# Patient Record
Sex: Male | Born: 1990 | Race: Black or African American | Hispanic: No | Marital: Single | Smoking: Current every day smoker
Health system: Southern US, Community
[De-identification: ages and names within clinical notes are randomized; demographics above are authoritative.]

## PROBLEM LIST (undated history)

## (undated) HISTORY — PX: KNEE SURGERY: SHX244

## (undated) HISTORY — PX: HIP SURGERY: SHX245

---

## 2018-07-06 ENCOUNTER — Encounter: Payer: Self-pay | Admitting: Emergency Medicine

## 2018-07-06 ENCOUNTER — Other Ambulatory Visit: Payer: Self-pay

## 2018-07-06 ENCOUNTER — Emergency Department
Admission: EM | Admit: 2018-07-06 | Discharge: 2018-07-06 | Disposition: A | Discharge status: Home / Self Care | Payer: No Typology Code available for payment source | Attending: Student in an Organized Health Care Education/Training Program | Admitting: Student in an Organized Health Care Education/Training Program

## 2018-07-06 DIAGNOSIS — M549 Dorsalgia, unspecified: Secondary | ICD-10-CM | POA: Insufficient documentation

## 2018-07-06 MED ORDER — OXYCODONE-ACETAMINOPHEN 7.5-325 MG PO TABS: 1.0000 | ORAL_TABLET | Freq: Four times a day (QID) | ORAL | 0 refills | Status: AC | PRN

## 2018-07-06 MED ORDER — CYCLOBENZAPRINE HCL 10 MG PO TABS
10.0000 mg | ORAL_TABLET | Freq: Once | ORAL | Status: AC
  Administered 2018-07-06: 10 mg via ORAL
  Filled 2018-07-06: qty 1

## 2018-07-06 MED ORDER — IBUPROFEN 600 MG PO TABS: 600.0000 mg | ORAL_TABLET | Freq: Three times a day (TID) | ORAL | 0 refills | Status: DC | PRN

## 2018-07-06 MED ORDER — OXYCODONE-ACETAMINOPHEN 5-325 MG PO TABS
1.0000 | ORAL_TABLET | Freq: Once | ORAL | Status: AC
  Administered 2018-07-06: 1 via ORAL
  Filled 2018-07-06: qty 1

## 2018-07-06 MED ORDER — IBUPROFEN 600 MG PO TABS
600.0000 mg | ORAL_TABLET | Freq: Once | ORAL | Status: AC
  Administered 2018-07-06: 600 mg via ORAL
  Filled 2018-07-06: qty 1

## 2018-07-06 MED ORDER — CYCLOBENZAPRINE HCL 10 MG PO TABS: 10.0000 mg | ORAL_TABLET | Freq: Three times a day (TID) | ORAL | 0 refills | Status: DC | PRN
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
## 2018-07-06 NOTE — Discharge Instructions (Addendum)
Take medication as directed.

## 2019-02-16 ENCOUNTER — Emergency Department
Admission: EM | Admit: 2019-02-16 | Discharge: 2019-02-16 | Disposition: A | Discharge status: Home / Self Care | Payer: Self-pay | Attending: Emergency Medicine | Admitting: Emergency Medicine

## 2019-02-16 ENCOUNTER — Other Ambulatory Visit: Payer: Self-pay

## 2019-02-16 ENCOUNTER — Emergency Department: Payer: Self-pay

## 2019-02-16 DIAGNOSIS — M79604 Pain in right leg: Secondary | ICD-10-CM | POA: Insufficient documentation

## 2019-02-16 DIAGNOSIS — F17200 Nicotine dependence, unspecified, uncomplicated: Secondary | ICD-10-CM | POA: Insufficient documentation

## 2019-02-16 DIAGNOSIS — M1711 Unilateral primary osteoarthritis, right knee: Secondary | ICD-10-CM | POA: Insufficient documentation

## 2019-02-16 MED ORDER — IBUPROFEN 600 MG PO TABS: 600.0000 mg | ORAL_TABLET | Freq: Four times a day (QID) | ORAL | 0 refills | Status: DC | PRN

## 2019-02-16 MED ORDER — MELOXICAM 7.5 MG PO TABS
15.0000 mg | ORAL_TABLET | Freq: Every day | ORAL | Status: DC
  Administered 2019-02-16: 15 mg via ORAL
  Filled 2019-02-16: qty 2

## 2019-02-16 NOTE — ED Provider Notes (Signed)
[facility] Emergency Department Provider Note  ____________________________________________  Time seen: Approximately 5:55 PM  I have reviewed the triage vital signs and the nursing notes.   HISTORY  Chief Complaint Leg Pain    HPI Trevor Durham is a 29 y.o. male that presents to the emergency department for evaluation of right lateral knee pain worsening over 2 weeks.  Patient has had surgery on his legs before to try and straighten them.  Patient denies any injury.  He is on his feet a lot at work.  No specific trauma.  He denies any IV drug use.  No wounds.  No fevers.  History reviewed. No pertinent past medical history.  There are no problems to display for this patient.   Past Surgical History:  Procedure Laterality Date  . HIP SURGERY      Prior to Admission medications   Medication Sig Start Date End Date Taking? Authorizing Provider  cyclobenzaprine (FLEXERIL) 10 MG tablet Take 1 tablet (10 mg total) by mouth 3 (three) times daily as needed. 07/06/18   Sable Feil, PA-C  ibuprofen (ADVIL) 600 MG tablet Take 1 tablet (600 mg total) by mouth every 6 (six) hours as needed. 02/16/19   Laban Emperor, PA-C    Allergies Sulfa antibiotics  No family history on file.  Social History Social History   Tobacco Use  . Smoking status: Current Every Day Smoker  . Smokeless tobacco: Never Used  Substance Use Topics  . Alcohol use: Yes    Comment: social  . Drug use: Yes    Types: Marijuana     Review of Systems  Respiratory: No SOB. Gastrointestinal: No abdominal pain.  No nausea, no vomiting.  Musculoskeletal: Positive for knee pain. Skin: Negative for rash, abrasions, lacerations, ecchymosis. Neurological: Negative for headaches   ____________________________________________   PHYSICAL EXAM:  VITAL SIGNS: ED Triage Vitals  Enc Vitals Group     BP 02/16/19 1646 (!) 183/73     Pulse Rate 02/16/19 1646 67     Resp 02/16/19 1646  18     Temp 02/16/19 1646 97.9 F (36.6 C)     Temp Source 02/16/19 1646 Oral     SpO2 02/16/19 1646 98 %     Weight 02/16/19 1659 280 lb (127 kg)     Height 02/16/19 1659 6\' 2"  (1.88 m)     Head Circumference --      Peak Flow --      Pain Score 02/16/19 1659 8     Pain Loc --      Pain Edu? --      Excl. in [place]? --      Constitutional: Alert and oriented. Well appearing and in no acute distress.  Strong smell of marijuana. Eyes: Conjunctivae are normal. PERRL. EOMI. Head: Atraumatic. ENT:      Ears:      Nose: No congestion/rhinnorhea.      Mouth/Throat: Mucous membranes are moist.  Neck: No stridor. Cardiovascular: Normal rate, regular rhythm.  Good peripheral circulation. Respiratory: Normal respiratory effort without tachypnea or retractions. Lungs CTAB. Good air entry to the bases with no decreased or absent breath sounds. Musculoskeletal: Full range of motion to all extremities. No gross deformities appreciated.  Tenderness to palpation to right lateral knee.  Lateral knee is warm to touch.  No overlying erythema. Neurologic:  Normal speech and language. No gross focal neurologic deficits are appreciated.  Skin:  Skin is warm, dry and intact. No rash noted.  Psychiatric: Mood and affect are normal. Speech and behavior are normal. Patient exhibits appropriate insight and judgement.   ____________________________________________   LABS (all labs ordered are listed, but only abnormal results are displayed)  Labs Reviewed - No data to display ____________________________________________  EKG   ____________________________________________  RADIOLOGY Lexine Baton, personally viewed and evaluated these images (plain radiographs) as part of my medical decision making, as well as reviewing the written report by the radiologist.  US Venous Img Lower Unilateral Right  Result Date: 02/16/2019 CLINICAL DATA:  Knee pain and leg swelling for 2 weeks. EXAM: RIGHT LOWER  EXTREMITY VENOUS DOPPLER ULTRASOUND TECHNIQUE: Gray-scale sonography with compression, as well as color and duplex ultrasound, were performed to evaluate the deep venous system(s) from the level of the common femoral vein through the popliteal and proximal calf veins. COMPARISON:  None. FINDINGS: VENOUS Normal compressibility of the common femoral, superficial femoral, and popliteal veins, as well as the visualized calf veins. Visualized portions of profunda femoral vein and great saphenous vein unremarkable. No filling defects to suggest DVT on grayscale or color Doppler imaging. Doppler waveforms show normal direction of venous flow, normal respiratory phasicity and response to augmentation. Limited views of the contralateral common femoral vein are unremarkable. OTHER None. Limitations: none IMPRESSION: No right lower extremity femoropopliteal DVT nor evidence of DVT within the visualized calf veins. If clinical symptoms are inconsistent or if there are persistent or worsening symptoms, further imaging (possibly involving the iliac veins) may be warranted. Electronically Signed   By: Emmaline Kluver M.D.   On: 02/16/2019 18:55   DG Knee Complete 4 Views Right  Result Date: 02/16/2019 CLINICAL DATA:  Knee pain and swelling. EXAM: RIGHT KNEE - COMPLETE 4+ VIEW COMPARISON:  None. FINDINGS: Chronic varus deformity of the knee due to deformity of the proximal tibia. Tricompartmental osteoarthritis, most pronounced in the medial compartment. 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 IMPRESSION: Tricompartmental osteoarthritis of the knee joint. Varus deformity. 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 Specific type indeterminate Electronically Signed   By: Paulina Fusi M.D.   On: 02/16/2019 18:21    ____________________________________________    PROCEDURES  Procedure(s) performed:     Procedures    Medications  meloxicam (MOBIC) tablet 15 mg (15 mg Oral Given 02/16/19 1948)     ____________________________________________   INITIAL IMPRESSION / ASSESSMENT AND PLAN / ED COURSE  Pertinent labs & imaging results that were available during my care of the patient were reviewed by me and considered in my medical decision making (see chart for details).  Review of the [place] CSRS was performed in accordance of the [facility] prior to dispensing any controlled drugs.   Patient presented to emergency department for evaluation of knee pain.  Vital signs and exam are reassuring.  Knee x-ray consistent with osteoarthritis and chronic changes.  Lower extremity ultrasound negative for DVT.  Patient was given Mobic for pain.  Patient will be discharged home with prescriptions for ibuprofen.  Patient is to follow up with primary care and orthopedics as directed. Patient is given ED precautions to return to the ED for any worsening or new symptoms.   Trevor Durham was evaluated in Emergency Department on 02/16/2019 for the symptoms described in the history of present illness. He was evaluated in the context of the global COVID-19 pandemic, which necessitated consideration that the patient might be at risk for infection with the SARS-CoV-2 virus that causes  COVID-19. Institutional protocols and algorithms that pertain to the evaluation of patients at risk for COVID-19 are in a state of rapid change based on information released by regulatory bodies including the CDC and federal and state organizations. These policies and algorithms were followed during the patient's care in the ED.  ____________________________________________  FINAL CLINICAL IMPRESSION(S) / ED DIAGNOSES  Final diagnoses:  Osteoarthritis of right knee, unspecified osteoarthritis type      NEW MEDICATIONS STARTED DURING THIS VISIT:  ED Discharge Orders         Ordered    ibuprofen (ADVIL) 600 MG tablet  Every 6 hours  PRN     02/16/19 2016              This chart was dictated using voice recognition software/Dragon. Despite best efforts to proofread, errors can occur which can change the meaning. Any change was purely unintentional.    Enid Derry, PA-C 02/16/19 2157    Concha Se, MD 02/19/19 [phone]

## 2019-02-16 NOTE — ED Triage Notes (Signed)
Pt to the er for right leg pain that is from the knee and shoots pain down the lateral side of the leg. Pt denies injury. Pt reports he walks a lot at work .

## 2019-08-22 ENCOUNTER — Emergency Department
Admission: EM | Admit: 2019-08-22 | Discharge: 2019-08-22 | Disposition: A | Discharge status: Home / Self Care | Payer: Self-pay | Attending: Emergency Medicine | Admitting: Emergency Medicine

## 2019-08-22 ENCOUNTER — Other Ambulatory Visit: Payer: Self-pay

## 2019-08-22 ENCOUNTER — Emergency Department: Payer: Self-pay

## 2019-08-22 ENCOUNTER — Encounter: Payer: Self-pay | Admitting: Emergency Medicine

## 2019-08-22 DIAGNOSIS — F17210 Nicotine dependence, cigarettes, uncomplicated: Secondary | ICD-10-CM | POA: Insufficient documentation

## 2019-08-22 DIAGNOSIS — M25561 Pain in right knee: Secondary | ICD-10-CM | POA: Insufficient documentation

## 2019-08-22 NOTE — ED Triage Notes (Signed)
Pt in via POV, reports right knee pain x 2 days, denies any recent injury.  Ambulatory to triage, NAD noted at this time.

## 2019-08-22 NOTE — ED Notes (Signed)
See triage note  Presents with right knee pain  States pain started couple of days ago w/o known injury  Ambulates well to treatment room

## 2019-08-22 NOTE — ED Provider Notes (Signed)
[facility] Emergency Department Provider Note  ____________________________________________   First MD Initiated Contact with Patient 08/22/19 1524     (approximate)  I have reviewed the triage vital signs and the nursing notes.   HISTORY  Chief Complaint Knee Pain   HPI Trevor Durham is a 29 y.o. male who presents to the emergency department for evaluation of right knee pain.  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  Patient states that he was walking on it more than usual.  Pain is made worse by ambulation, better with rest.  There was no trauma or acute moment of pain, but rather gradual onset.  Patient has extensive surgical history of the bilateral lower extremity with an attempted correction of bow legs.  This occurred when he was a child.  He has had knee pain before, this was last treated approximately 10 years ago.        History reviewed. No pertinent past medical history.  There are no problems to display for this patient.   Past Surgical History:  Procedure Laterality Date  . HIP SURGERY    . KNEE SURGERY      Prior to Admission medications   Not on File    Allergies Sulfa antibiotics  No family history on file.  Social History Social History   Tobacco Use  . Smoking status: Current Every Day Smoker    Packs/day: 0.25    Types: Cigarettes  . Smokeless tobacco: Never Used  Vaping Use  . Vaping Use: Never used  Substance Use Topics  . Alcohol use: Yes  . Drug use: Yes    Types: Marijuana    Review of Systems  Constitutional: No fever/chills Cardiovascular: Denies chest pain. Respiratory: Denies shortness of breath. Gastrointestinal: No abdominal pain.  No nausea, no vomiting.   Genitourinary: Negative for dysuria. Musculoskeletal: + Right knee pain, negative for back pain. Skin: Negative for rash. Neurological: Negative for headaches, focal weakness or  numbness.  ____________________________________________   PHYSICAL EXAM:  VITAL SIGNS: ED Triage Vitals  Enc Vitals Group     BP 08/22/19 1453 (!) 160/74     Pulse Rate 08/22/19 1453 (!) 56     Resp 08/22/19 1453 15     Temp 08/22/19 1453 97.7 F (36.5 C)     Temp Source 08/22/19 1453 Oral     SpO2 08/22/19 1453 98 %     Weight 08/22/19 1454 (!) 305 lb (138.3 kg)     Height 08/22/19 1454 6\' 1"  (1.854 m)     Head Circumference --      Peak Flow --      Pain Score 08/22/19 1454 8     Pain Loc --      Pain Edu? --      Excl. in [place]? --     Constitutional: Alert and oriented. Well appearing and in no acute distress. Head: Atraumatic. Neck: No cervical spine tenderness to palpation. Cardiovascular:   Good peripheral circulation. Musculoskeletal: The patient has limited range of motion of the bilateral knees secondary to body habitus.  The patient has no tenderness to palpation of either knee.  Ligamentous evaluation is difficult secondary to body habitus.   Neurologic:  Normal speech and language. No gross focal neurologic deficits are appreciated. No gait instability. Skin:  Skin is warm, dry and intact.  There is scarring present on the right lower extremity secondary to prior surgeries. Psychiatric:  Mood and affect are normal. Speech and behavior are normal.   ____________________________________________  RADIOLOGY   Official radiology report(s): DG Knee Complete 4 Views Right  Result Date: 08/22/2019 CLINICAL DATA:  Right knee pain x2 weeks. EXAM: RIGHT KNEE - COMPLETE 4+ VIEW COMPARISON:  None. FINDINGS: No evidence of an acute fracture or dislocation. A chronic deformity is seen involving the proximal right tibia. Mild narrowing of the medial and lateral tibiofemoral compartment spaces is seen. A small joint effusion is noted. IMPRESSION: 1. No evidence of an acute fracture. 2. Small joint effusion. Electronically Signed   By: Aram Candela M.D.   On: 08/22/2019 15:49      ____________________________________________   INITIAL IMPRESSION / ASSESSMENT AND PLAN / ED COURSE  As part of my medical decision making, I reviewed the following data within the electronic MEDICAL RECORD NUMBER Nursing notes reviewed and incorporated        Trevor Durham is a 29 year old male who presents to the emergency department for evaluation of right knee pain.  The onset was gradual over the course of a 24-hour workday.  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  X-rays of the right lower extremity are negative for any acute bony injury.  Will recommend that he follow-up with orthopedics given his extensive Ortho history on his lower extremities.  Recommend that he use 800 mg of ibuprofen every 8 hours alternated with Tylenol 500 mg every 4-6 hours as needed for pain.  A work note was provided to the patient   Trevor Durham was evaluated in Emergency Department on 08/22/2019 for the symptoms described in the history of present illness. He was evaluated in the context of the global COVID-19 pandemic, which necessitated consideration that the patient might be at risk for infection with the SARS-CoV-2 virus that causes COVID-19. Institutional protocols and algorithms that pertain to the evaluation of patients at risk for COVID-19 are in a state of rapid change based on information released by regulatory bodies including the CDC and federal and state organizations. These policies and algorithms were followed during the patient's care in the ED.      ____________________________________________   FINAL CLINICAL IMPRESSION(S) / ED DIAGNOSES  Final diagnoses:  Acute pain of right knee     ED Discharge Orders    None       Note:  This document was prepared using Dragon voice recognition software and may include unintentional dictation errors.    Lucy Chris,  PA 08/22/19 1817    Sharman Cheek, MD 08/23/19 [phone]

## 2019-08-22 NOTE — Discharge Instructions (Signed)
Please take ibuprofen 800 mg every 8 hours as needed for pain.  You may alternate this with Tylenol 500 mg every 4-6 hours as needed for pain.

## 2019-09-28 ENCOUNTER — Emergency Department: Payer: Self-pay

## 2019-09-28 ENCOUNTER — Emergency Department
Admission: EM | Admit: 2019-09-28 | Discharge: 2019-09-28 | Disposition: A | Discharge status: Home / Self Care | Payer: Self-pay | Attending: Emergency Medicine | Admitting: Emergency Medicine

## 2019-09-28 ENCOUNTER — Other Ambulatory Visit: Payer: Self-pay

## 2019-09-28 DIAGNOSIS — R5383 Other fatigue: Secondary | ICD-10-CM | POA: Insufficient documentation

## 2019-09-28 DIAGNOSIS — F17210 Nicotine dependence, cigarettes, uncomplicated: Secondary | ICD-10-CM | POA: Insufficient documentation

## 2019-09-28 DIAGNOSIS — H538 Other visual disturbances: Secondary | ICD-10-CM | POA: Insufficient documentation

## 2019-09-28 LAB — DIFFERENTIAL
Abs Immature Granulocytes: 0.01 10*3/uL (ref 0.00–0.07)
Basophils Absolute: 0.1 10*3/uL (ref 0.0–0.1)
Basophils Relative: 1 %
Eosinophils Absolute: 0.2 10*3/uL (ref 0.0–0.5)
Eosinophils Relative: 3 %
Immature Granulocytes: 0 %
Lymphocytes Relative: 26 %
Lymphs Abs: 1.8 10*3/uL (ref 0.7–4.0)
Monocytes Absolute: 0.5 10*3/uL (ref 0.1–1.0)
Monocytes Relative: 7 %
Neutro Abs: 4.4 10*3/uL (ref 1.7–7.7)
Neutrophils Relative %: 63 %

## 2019-09-28 LAB — CBC
HCT: 40.8 % (ref 39.0–52.0)
Hemoglobin: 14.2 g/dL (ref 13.0–17.0)
MCH: 29.3 pg (ref 26.0–34.0)
MCHC: 34.8 g/dL (ref 30.0–36.0)
MCV: 84.3 fL (ref 80.0–100.0)
Platelets: 214 10*3/uL (ref 150–400)
RBC: 4.84 MIL/uL (ref 4.22–5.81)
RDW: 13.6 % (ref 11.5–15.5)
WBC: 7 10*3/uL (ref 4.0–10.5)
nRBC: 0 % (ref 0.0–0.2)

## 2019-09-28 LAB — COMPREHENSIVE METABOLIC PANEL
ALT: 27 U/L (ref 0–44)
AST: 31 U/L (ref 15–41)
Albumin: 3.8 g/dL (ref 3.5–5.0)
Alkaline Phosphatase: 37 U/L — ABNORMAL LOW (ref 38–126)
Anion gap: 9 (ref 5–15)
BUN: 12 mg/dL (ref 6–20)
CO2: 23 mmol/L (ref 22–32)
Calcium: 9.1 mg/dL (ref 8.9–10.3)
Chloride: 108 mmol/L (ref 98–111)
Creatinine, Ser: 0.68 mg/dL (ref 0.61–1.24)
GFR calc Af Amer: >60 mL/min (ref >60)
GFR calc non Af Amer: >60 mL/min (ref >60)
Glucose, Bld: 90 mg/dL (ref 70–99)
Potassium: 3.7 mmol/L (ref 3.5–5.1)
Sodium: 140 mmol/L (ref 135–145)
Total Bilirubin: 0.6 mg/dL (ref 0.3–1.2)
Total Protein: 7.3 g/dL (ref 6.5–8.1)

## 2019-09-28 LAB — APTT: aPTT: 31 seconds (ref 24–36)

## 2019-09-28 NOTE — ED Provider Notes (Signed)
[facility] Emergency Department Provider Note  Time seen: 9:37 PM  I have reviewed the triage vital signs and the nursing notes.   HISTORY  Chief Complaint Blurred Vision   HPI Trevor Durham is a 29 y.o. male with no past medical history presents emergency department for blurred vision while at work today. According to the patient he states he has been working every day including long shifts over the weekends. Patient thinks he is just been overworked, states he was at work this morning when he began feeling blurred vision and very tired. Patient was concerned so he left work and came to the emergency department for evaluation. Patient states he actually fell asleep in the waiting room while waiting to be seen and states since waking up he feels fairly normal denies any visual deficits at this time denies any blurred vision. Denies any headache no focal weakness or numbness.   No past medical history on file.  There are no problems to display for this patient.   Past Surgical History:  Procedure Laterality Date  . HIP SURGERY    . KNEE SURGERY      Prior to Admission medications   Not on File    Allergies  Allergen Reactions  . Sulfa Antibiotics Anaphylaxis    No family history on file.  Social History Social History   Tobacco Use  . Smoking status: Current Every Day Smoker    Packs/day: 0.25    Types: Cigarettes  . Smokeless tobacco: Never Used  Vaping Use  . Vaping Use: Never used  Substance Use Topics  . Alcohol use: Yes  . Drug use: Yes    Types: Marijuana    Review of Systems Constitutional: Negative for fever. Cardiovascular: Negative for chest pain. Respiratory: Negative for shortness of breath. Gastrointestinal: Negative for abdominal pain, vomiting Genitourinary: Negative for urinary compaints Musculoskeletal: Negative for musculoskeletal complaints Neurological: Negative for headache All other ROS  negative  ____________________________________________   PHYSICAL EXAM:  VITAL SIGNS: ED Triage Vitals [09/28/19 1552]  Enc Vitals Group     BP (!) 157/92     Pulse Rate 74     Resp 18     Temp 97.6 F (36.4 C)     Temp Source Oral     SpO2 98 %     Weight (!) 310 lb (140.6 kg)     Height 6\' 2"  (1.88 m)     Head Circumference      Peak Flow      Pain Score 0     Pain Loc      Pain Edu?      Excl. in GC?     Constitutional: Alert and oriented. Well appearing and in no distress. Eyes: Normal exam, EOMI, PERRL ENT      Head: Normocephalic and atraumatic.      Mouth/Throat: Mucous membranes are moist. Cardiovascular: Normal rate, regular rhythm. No murmur Respiratory: Normal respiratory effort without tachypnea nor retractions. Breath sounds are clear Gastrointestinal: Soft and nontender. No distention.   Musculoskeletal: Nontender with normal range of motion in all extremities. Neurologic:  Normal speech and language. No gross focal neurologic deficits  Skin:  Skin is warm, dry and intact.  Psychiatric: Mood and affect are normal.   ____________________________________________   RADIOLOGY  CT scan of the head is negative  ____________________________________________   INITIAL IMPRESSION / ASSESSMENT AND PLAN / ED COURSE  Pertinent labs & imaging results that were available during my care of  the patient were reviewed by me and considered in my medical decision making (see chart for details).   Patient presents to the emergency department for blurred vision. According to the patient he was at work when he began feeling blurred vision, states he has been overworked over stress and is very tired. Since taking a nap in the emergency department he states he feels much better denies any blurred vision at this time. Patient's work-up is overall reassuring including lab work and a CT scan of the head. Patient is somewhat hypertensive, discuss starting the patient blood  pressure medications he would rather hold off. We did discuss following up with a PCP for recheck which I believe is appropriate. We will discharge the patient home with PCP follow-up I also discussed return precautions. Patient agreeable to plan of care.  Trevor Durham was evaluated in Emergency Department on 09/28/2019 for the symptoms described in the history of present illness. He was evaluated in the context of the global COVID-19 pandemic, which necessitated consideration that the patient might be at risk for infection with the SARS-CoV-2 virus that causes COVID-19. Institutional protocols and algorithms that pertain to the evaluation of patients at risk for COVID-19 are in a state of rapid change based on information released by regulatory bodies including the CDC and federal and state organizations. These policies and algorithms were followed during the patient's care in the ED.  ____________________________________________   FINAL CLINICAL IMPRESSION(S) / ED DIAGNOSES  Blurred vision Fatigue   Minna Antis, MD 09/28/19 2140

## 2019-09-28 NOTE — ED Triage Notes (Signed)
Pt here with blurred vision that started this morning. Pt also c/o of dizziness. Pt thinks that it is related to stress from work. Pt denies any other symptoms related to balance or headahce. Pt NAD in triage.

## 2021-06-17 ENCOUNTER — Emergency Department: Payer: Self-pay

## 2021-06-17 ENCOUNTER — Encounter: Payer: Self-pay | Admitting: Emergency Medicine

## 2021-06-17 DIAGNOSIS — S46912A Strain of unspecified muscle, fascia and tendon at shoulder and upper arm level, left arm, initial encounter: Secondary | ICD-10-CM | POA: Insufficient documentation

## 2021-06-17 DIAGNOSIS — M62838 Other muscle spasm: Secondary | ICD-10-CM | POA: Insufficient documentation

## 2021-06-17 DIAGNOSIS — X58XXXA Exposure to other specified factors, initial encounter: Secondary | ICD-10-CM | POA: Insufficient documentation

## 2021-06-17 MED ORDER — OXYCODONE-ACETAMINOPHEN 5-325 MG PO TABS
1.0000 | ORAL_TABLET | Freq: Once | ORAL | Status: AC
  Administered 2021-06-17: 1 via ORAL
  Filled 2021-06-17: qty 1

## 2021-06-17 NOTE — ED Triage Notes (Addendum)
Pt presents via POV with complaints of left arm pain that started yesterday. Pt endorses pain when trying to move his arm towards his face but all other ROM intact. CNS intact, cap refill <3 secs, no deformities noted. Denies injury or falls.

## 2021-06-18 ENCOUNTER — Emergency Department
Admission: EM | Admit: 2021-06-18 | Discharge: 2021-06-18 | Disposition: A | Discharge status: Home / Self Care | Payer: Self-pay | Attending: Emergency Medicine | Admitting: Emergency Medicine

## 2021-06-18 DIAGNOSIS — T148XXA Other injury of unspecified body region, initial encounter: Secondary | ICD-10-CM

## 2021-06-18 DIAGNOSIS — M62838 Other muscle spasm: Secondary | ICD-10-CM

## 2021-06-18 DIAGNOSIS — M79602 Pain in left arm: Secondary | ICD-10-CM

## 2021-06-18 MED ORDER — KETOROLAC TROMETHAMINE 30 MG/ML IJ SOLN
30.0000 mg | Freq: Once | INTRAMUSCULAR | Status: AC
  Administered 2021-06-18: 30 mg via INTRAMUSCULAR
  Filled 2021-06-18: qty 1

## 2021-06-18 MED ORDER — LIDOCAINE 5 % EX PTCH
1.0000 | MEDICATED_PATCH | CUTANEOUS | Status: DC
  Administered 2021-06-18: 1 via TRANSDERMAL
  Filled 2021-06-18: qty 1

## 2021-06-18 MED ORDER — LIDOCAINE 5 % EX PTCH: 1.0000 | MEDICATED_PATCH | Freq: Two times a day (BID) | CUTANEOUS | 0 refills | Status: AC

## 2021-06-18 NOTE — Discharge Instructions (Addendum)
Please take Tylenol and ibuprofen/Advil for your pain.  It is safe to take them together, or to alternate them every few hours.  Take up to 1000mg of Tylenol at a time, up to 4 times per day.  Do not take more than 4000 mg of Tylenol in 24 hours.  For ibuprofen, take 400-600 mg, 3 - 4 times per day.  Please use lidocaine patches at your site of pain.  Apply 1 patch at a time, leave on for 12 hours, then remove for 12 hours.  12 hours on, 12 hours off.  Do not apply more than 1 patch at a time.  

## 2021-06-18 NOTE — ED Provider Notes (Signed)
[facility] Provider Note    None    (approximate)   History   Arm Pain   HPI  Nazim Kadlec is a 31 y.o. male who presents to the ED for evaluation of Arm Pain   Patient presents to the ED for evaluation of atraumatic left arm pain.  Reports pain to left mid arm anteriorly around the antecubital fossa.  Reports pain to the proximal forearm and distal bicep around this area.  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  Did not have any pain prior to this.  No discrete injuries or trauma.  Reports pain with flexion, but none at rest.  Left hand still functions at baseline.  He is right-hand dominant.  Physical Exam   Triage Vital Signs: ED Triage Vitals  Enc Vitals Group     BP 06/17/21 2129 (!) 178/89     Pulse Rate 06/17/21 2129 72     Resp 06/17/21 2129 20     Temp 06/17/21 2129 97.6 F (36.4 C)     Temp Source 06/17/21 2129 Oral     SpO2 06/17/21 2129 98 %     Weight 06/17/21 2130 (!) 315 lb (142.9 kg)     Height 06/17/21 2130 6\' 2"  (1.88 m)     Head Circumference --      Peak Flow --      Pain Score 06/17/21 2130 10     Pain Loc --      Pain Edu? --      Excl. in GC? --     Most recent vital signs: Vitals:   06/18/21 0149 06/18/21 0208  BP:  (!) 168/90  Pulse: 68 71  Resp: 20 18  Temp:  98.1 F (36.7 C)  SpO2: 98% 98%    General: Awake, no distress.  Morbidly obese.  Well-appearing. CV:  Good peripheral perfusion.  Resp:  Normal effort.  Abd:  No distention.  MSK:  No deformity noted.  No soft tissue swelling, signs of trauma, erythema, warmth to the area of pain.  He is tender to the proximal volar/anterior left forearm, his antecubital fossa and the distal bicep. He is quite tense and with verbal redirection and reassurance, he does relax this arm and allows me to passively flex it with some mild  discomfort. Distally, radial pulses 2+ and symmetric to the right.  Full strength with handgrips and sensation intact throughout.  Cap refill is brisk. Neuro:  No focal deficits appreciated. Other:     ED Results / Procedures / Treatments   Labs (all labs ordered are listed, but only abnormal results are displayed) Labs Reviewed - No data to display  EKG   RADIOLOGY Plain film of the left forearm interpreted by me without evidence of fracture or dislocation. Plain film of the left elbow interpreted by me without evidence of fracture or dislocation.  Official radiology report(s): DG Forearm Left  Result Date: 06/17/2021 CLINICAL DATA:  Left arm pain EXAM: LEFT FOREARM - 2 VIEW COMPARISON:  None Available. FINDINGS: There is no evidence of fracture or other focal bone lesions. Soft tissues are unremarkable. IMPRESSION: Negative. Electronically Signed   By: 08/17/2021 M.D.   On: 06/17/2021 22:03   DG Elbow Complete Left  Result Date: 06/17/2021 CLINICAL DATA:  Left arm pain EXAM: LEFT ELBOW - COMPLETE  3+ VIEW COMPARISON:  None Available. FINDINGS: There is no evidence of fracture, dislocation, or joint effusion. There is no evidence of arthropathy or other focal bone abnormality. Soft tissues are unremarkable. IMPRESSION: Negative. Electronically Signed   By: Charlett Nose M.D.   On: 06/17/2021 22:02    PROCEDURES and INTERVENTIONS:  Procedures  Medications  lidocaine (LIDODERM) 5 % 1 patch (1 patch Transdermal Patch Applied 06/18/21 0207)  oxyCODONE-acetaminophen (PERCOCET/ROXICET) 5-325 MG per tablet 1 tablet (1 tablet Oral Given 06/17/21 2134)  ketorolac (TORADOL) 30 MG/ML injection 30 mg (30 mg Intramuscular Given 06/18/21 0200)     IMPRESSION / MDM / ASSESSMENT AND PLAN / ED COURSE  I reviewed the triage vital signs and the nursing notes.  Differential diagnosis includes, but is not limited to, soft tissue strain or spasm.  Cellulitis, DVT  31 year old male presents to the  ED with atraumatic left arm pain possibly due to overuse and strain/spasm.  He looks systemically well and has tenderness/pain with palpation and active flexion of the left bicep.  Palpable muscular cord without overlying signs of DVT.  Improving pain with Toradol and lidocaine patch.  We discussed continued NSAIDs and conservative measures at home with appropriate return precautions for the ED.  Clinical Course as of 06/18/21 2440  Tue Jun 18, 2021  0210 Reassessed and discussed plan of care again [DS]    Clinical Course User Index [DS] Delton Prairie, MD     FINAL CLINICAL IMPRESSION(S) / ED DIAGNOSES   Final diagnoses:  Left arm pain  Muscle spasm  Muscle strain     Rx / DC Orders   ED Discharge Orders          Ordered    lidocaine (LIDODERM) 5 %  Every 12 hours        06/18/21 0153             Note:  This document was prepared using Dragon voice recognition software and may include unintentional dictation errors.   Delton Prairie, MD 06/18/21 [phone]

## 2022-01-30 ENCOUNTER — Other Ambulatory Visit: Payer: Self-pay

## 2022-01-30 ENCOUNTER — Emergency Department
Admission: EM | Admit: 2022-01-30 | Discharge: 2022-01-30 | Disposition: A | Discharge status: Home / Self Care | Payer: Self-pay | Attending: Emergency Medicine | Admitting: Emergency Medicine

## 2022-01-30 ENCOUNTER — Emergency Department: Payer: Self-pay

## 2022-01-30 DIAGNOSIS — I159 Secondary hypertension, unspecified: Secondary | ICD-10-CM

## 2022-01-30 DIAGNOSIS — R079 Chest pain, unspecified: Secondary | ICD-10-CM

## 2022-01-30 DIAGNOSIS — Z720 Tobacco use: Secondary | ICD-10-CM | POA: Insufficient documentation

## 2022-01-30 LAB — BASIC METABOLIC PANEL
Anion gap: 4 — ABNORMAL LOW (ref 5–15)
BUN: 10 mg/dL (ref 6–20)
CO2: 23 mmol/L (ref 22–32)
Calcium: 8.7 mg/dL — ABNORMAL LOW (ref 8.9–10.3)
Chloride: 110 mmol/L (ref 98–111)
Creatinine, Ser: 0.76 mg/dL (ref 0.61–1.24)
GFR, Estimated: >60 mL/min (ref >60)
Glucose, Bld: 114 mg/dL — ABNORMAL HIGH (ref 70–99)
Potassium: 3.5 mmol/L (ref 3.5–5.1)
Sodium: 137 mmol/L (ref 135–145)

## 2022-01-30 LAB — CBC
HCT: 42.6 % (ref 39.0–52.0)
Hemoglobin: 14.6 g/dL (ref 13.0–17.0)
MCH: 28.8 pg (ref 26.0–34.0)
MCHC: 34.3 g/dL (ref 30.0–36.0)
MCV: 84 fL (ref 80.0–100.0)
Platelets: 228 10*3/uL (ref 150–400)
RBC: 5.07 MIL/uL (ref 4.22–5.81)
RDW: 13.7 % (ref 11.5–15.5)
WBC: 8.8 10*3/uL (ref 4.0–10.5)
nRBC: 0 % (ref 0.0–0.2)

## 2022-01-30 LAB — TROPONIN I (HIGH SENSITIVITY)
Troponin I (High Sensitivity): 6 ng/L (ref <18)
Troponin I (High Sensitivity): 5 ng/L (ref <18)

## 2022-01-30 MED ORDER — OMEPRAZOLE MAGNESIUM 20 MG PO TBEC: 20.0000 mg | DELAYED_RELEASE_TABLET | Freq: Every day | ORAL | 3 refills | Status: AC

## 2022-01-30 MED ORDER — IBUPROFEN 800 MG PO TABS
800.0000 mg | ORAL_TABLET | Freq: Once | ORAL | Status: AC
  Administered 2022-01-30: 800 mg via ORAL
  Filled 2022-01-30: qty 1

## 2022-01-30 NOTE — Discharge Instructions (Signed)
You are seen in the emergency department for chest pain.  You had 2 heart enzymes were normal, you are not having a heart attack.  Your chest x-ray was normal with no signs of pneumonia.  You had a mildly elevated blood pressure so it is importantly follow-up with a primary care physicians to have this rechecked.  You were started on acid reducing medication, take this every day as it may take a couple of weeks to have its full effect.  Try to stop smoking if possible :)  Pain control:  Ibuprofen (motrin/aleve/advil) - You can take 3-4 tablets (600-800 mg) every 6 hours as needed for pain/fever.  Acetaminophen (tylenol) - You can take 2 extra strength tablets (1000 mg) every 6 hours as needed for pain/fever.  You can alternate these medications or take them together.  Make sure you eat food/drink water when taking these medications.

## 2022-01-30 NOTE — ED Notes (Signed)
See triage note, pt reports chest pain for past 4 days. Denies n/v/shob. Nad noted.

## 2022-01-30 NOTE — ED Provider Notes (Signed)
[facility] Provider Note    Event Date/Time   First MD Initiated Contact with Patient 01/30/22 1354     (approximate)   History   Chest Pain   HPI  Trevor Durham is a 32 y.o. male past medical history significant for tobacco use, who presents to the emergency department chest pain.  Endorses 4 days of sharp chest pain to the left side in the middle.  Nothing improves or worsens.  Has not taken anything for the pain.  Denies any association with shortness of breath, nausea vomiting or diarrhea.  Does endorse tobacco use or marijuana use.  No family history of coronary artery disease at a young age.  No history of DVT or PE.  No recent travel.  Does not follow with a primary care provider.  Does endorse heavy lifting as a cook.     Physical Exam   Triage Vital Signs: ED Triage Vitals  Enc Vitals Group     BP 01/30/22 1100 (!) 181/89     Pulse Rate 01/30/22 1100 77     Resp 01/30/22 1100 16     Temp 01/30/22 1100 98.1 F (36.7 C)     Temp Source 01/30/22 1100 Oral     SpO2 01/30/22 1100 97 %     Weight 01/30/22 1101 (!) 380 lb (172.4 kg)     Height 01/30/22 1101 6\' 2"  (1.88 m)     Head Circumference --      Peak Flow --      Pain Score 01/30/22 1101 9     Pain Loc --      Pain Edu? --      Excl. in [place]? --     Most recent vital signs: Vitals:   01/30/22 1100 01/30/22 1330  BP: (!) 181/89 (!) 143/75  Pulse: 77 67  Resp: 16 18  Temp: 98.1 F (36.7 C)   SpO2: 97% 97%    Physical Exam Constitutional:      Appearance: He is well-developed.  HENT:     Head: Atraumatic.  Eyes:     Conjunctiva/sclera: Conjunctivae normal.  Cardiovascular:     Rate and Rhythm: Regular rhythm.  Pulmonary:     Effort: No respiratory distress.  Musculoskeletal:     Cervical back: Normal range of motion.  Skin:    General: Skin is warm.  Neurological:     Mental Status: He is alert. Mental status is at baseline.     IMPRESSION / MDM / ASSESSMENT AND  PLAN / ED COURSE  I reviewed the triage vital signs and the nursing notes.  Differential diagnosis including musculoskeletal, acid reflux/PUD, pericarditis, ACS, pneumonia.  Clinical picture is not concerning for PE, PERC negative.  EKG  I, Nathaniel Man, the attending physician, personally viewed and interpreted this ECG.   Rate: Normal  Rhythm: Normal sinus  Axis: Normal  Intervals: Normal  ST&T Change: None  No tachycardic or bradycardic dysrhythmias while on cardiac telemetry.  RADIOLOGY I independently reviewed imaging, my interpretation of imaging: No signs of pneumonia no widened mediastinum read as no acute findings.  LABS (all labs ordered are listed, but only abnormal results are displayed) Labs interpreted as -  Serial troponins negative.  Low risk heart score.  No concern for ACS  Labs Reviewed  BASIC METABOLIC PANEL - Abnormal; Notable for the following components:      Result Value   Glucose, Bld 114 (*)    Calcium 8.7 (*)  Anion gap 4 (*)    All other components within normal limits  CBC  TROPONIN I (HIGH SENSITIVITY)  TROPONIN I (HIGH SENSITIVITY)    TREATMENT   Discussed symptomatic treatment with NSAIDs and Tylenol.  Given prescription for omeprazole.  Given information to establish care with a primary care physician.  Given return precautions for any worsening symptoms.  Encouraged on smoking cessation.  Discussed his hypertension and need to follow-up as an outpatient to have it rechecked.   PROCEDURES:  Critical Care performed: No  Procedures  Patient's presentation is most consistent with acute presentation with potential threat to life or bodily function.   MEDICATIONS ORDERED IN ED: Medications  ibuprofen (ADVIL) tablet 800 mg (800 mg Oral Given 01/30/22 1512)    FINAL CLINICAL IMPRESSION(S) / ED DIAGNOSES   Final diagnoses:  Chest pain, unspecified type  Secondary hypertension     Rx / DC Orders   ED Discharge Orders           Ordered    omeprazole (PRILOSEC OTC) 20 MG tablet  Daily        01/30/22 1513             Note:  This document was prepared using Dragon voice recognition software and may include unintentional dictation errors.   Nathaniel Man, MD 01/30/22 1515

## 2022-01-30 NOTE — ED Triage Notes (Signed)
Pt c/o non-radiating CP x4 days with no other symptoms. Pt denies any other medical hx.

## 2023-03-13 IMAGING — DX DG FOREARM 2V*L*
2 series · 2 of 2 positions shown · non-contrast
Comparison: None Available.

CLINICAL DATA: Left arm pain

EXAM:
LEFT FOREARM - 2 VIEW

[forearm ap]
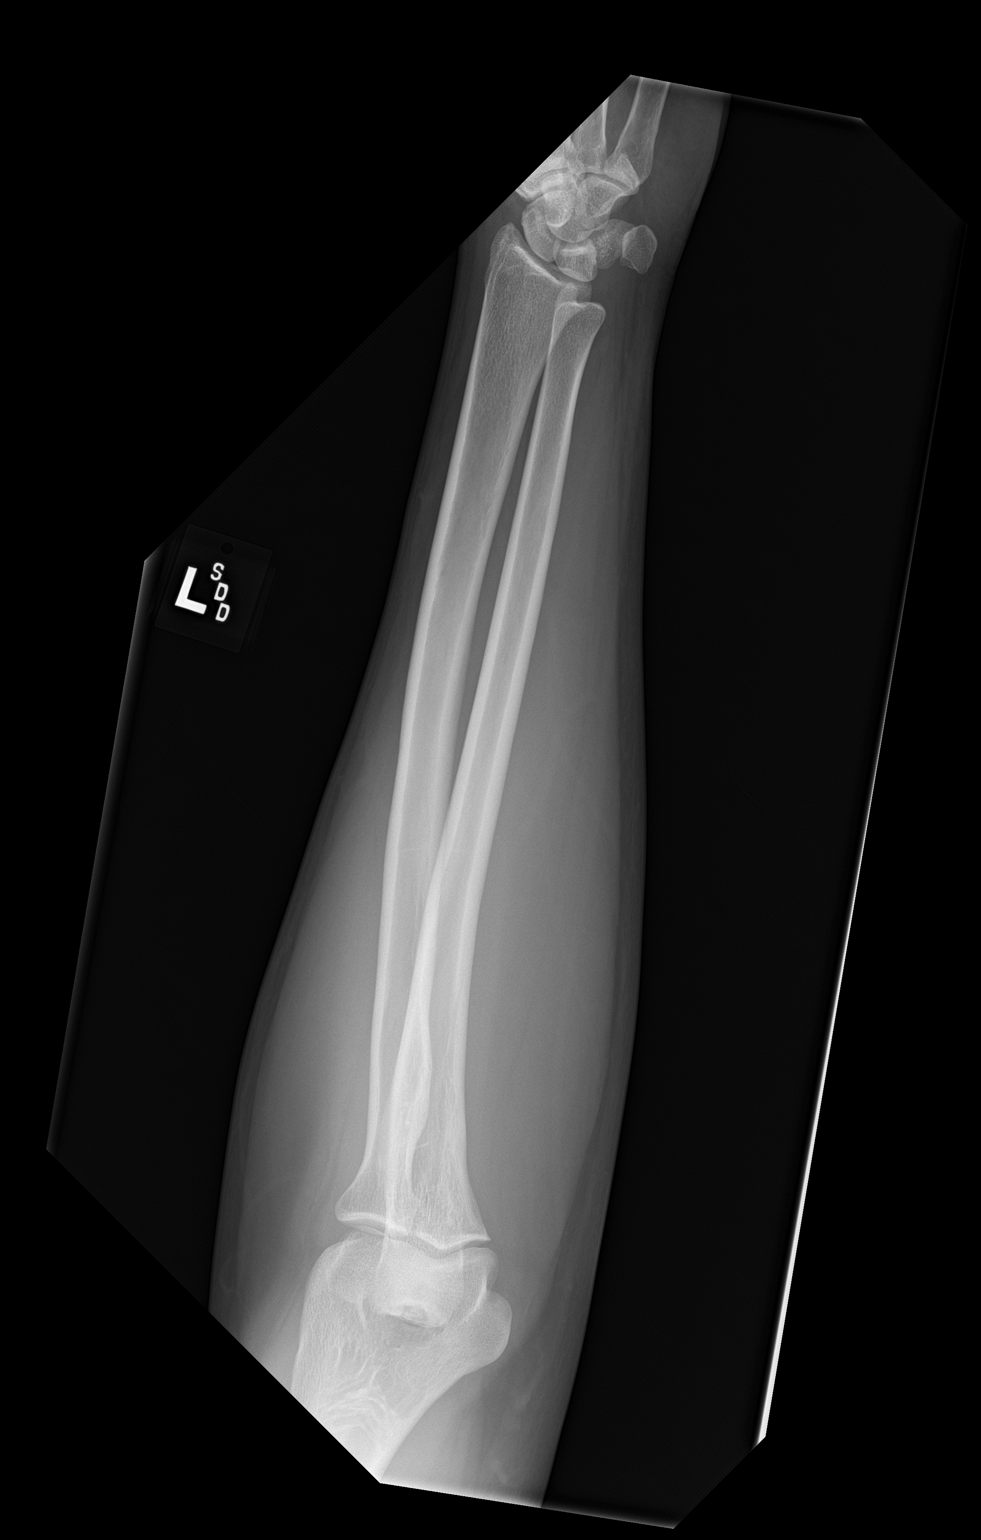

[forearm lat]
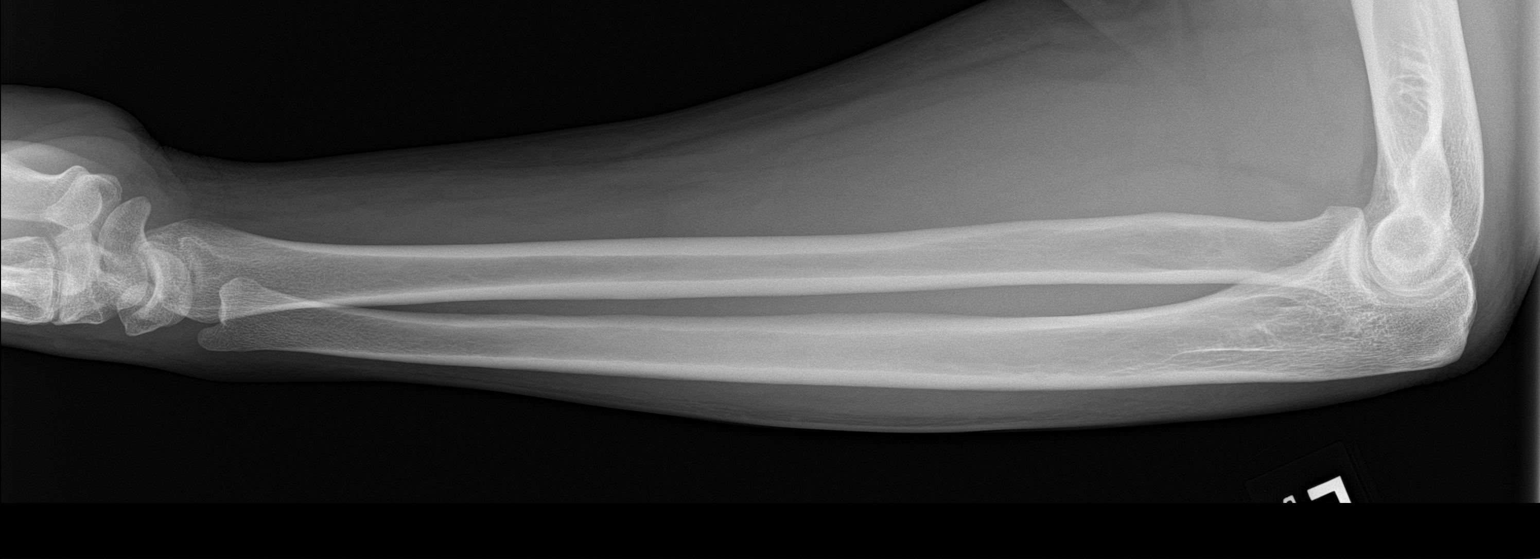

[2 of 2 positions shown; findings below may reference images not displayed]

FINDINGS: There is no evidence of fracture or other focal bone lesions. Soft
tissues are unremarkable.
IMPRESSION: Negative.
